# Patient Record
Sex: Male | Born: 1940 | Race: Black or African American | Hispanic: No | Marital: Married | Smoking: Never smoker
Health system: Southern US, Community
[De-identification: ages and names within clinical notes are randomized; demographics above are authoritative.]

---

## 2020-10-30 ENCOUNTER — Emergency Department (HOSPITAL_BASED_OUTPATIENT_CLINIC_OR_DEPARTMENT_OTHER): Payer: Medicare Other

## 2020-10-30 ENCOUNTER — Other Ambulatory Visit: Payer: Self-pay

## 2020-10-30 ENCOUNTER — Inpatient Hospital Stay (HOSPITAL_BASED_OUTPATIENT_CLINIC_OR_DEPARTMENT_OTHER)
Admission: EM | Admit: 2020-10-30 | Discharge: 2020-11-01 | DRG: 313 | Disposition: A | Discharge status: Home / Self Care | Payer: Medicare Other | Attending: Internal Medicine | Admitting: Internal Medicine

## 2020-10-30 ENCOUNTER — Encounter (HOSPITAL_BASED_OUTPATIENT_CLINIC_OR_DEPARTMENT_OTHER): Payer: Self-pay

## 2020-10-30 DIAGNOSIS — R079 Chest pain, unspecified: Secondary | ICD-10-CM | POA: Diagnosis present

## 2020-10-30 DIAGNOSIS — R7303 Prediabetes: Secondary | ICD-10-CM | POA: Diagnosis present

## 2020-10-30 DIAGNOSIS — Z20822 Contact with and (suspected) exposure to covid-19: Secondary | ICD-10-CM | POA: Diagnosis present

## 2020-10-30 DIAGNOSIS — E785 Hyperlipidemia, unspecified: Secondary | ICD-10-CM | POA: Diagnosis not present

## 2020-10-30 DIAGNOSIS — D509 Iron deficiency anemia, unspecified: Secondary | ICD-10-CM | POA: Diagnosis not present

## 2020-10-30 DIAGNOSIS — D649 Anemia, unspecified: Secondary | ICD-10-CM | POA: Diagnosis present

## 2020-10-30 DIAGNOSIS — Z886 Allergy status to analgesic agent status: Secondary | ICD-10-CM | POA: Diagnosis not present

## 2020-10-30 DIAGNOSIS — R001 Bradycardia, unspecified: Secondary | ICD-10-CM | POA: Diagnosis present

## 2020-10-30 DIAGNOSIS — R011 Cardiac murmur, unspecified: Secondary | ICD-10-CM | POA: Diagnosis present

## 2020-10-30 DIAGNOSIS — Z880 Allergy status to penicillin: Secondary | ICD-10-CM | POA: Diagnosis not present

## 2020-10-30 LAB — BASIC METABOLIC PANEL
Anion gap: 7 (ref 5–15)
BUN: 19 mg/dL (ref 8–23)
CO2: 25 mmol/L (ref 22–32)
Calcium: 9.4 mg/dL (ref 8.9–10.3)
Chloride: 106 mmol/L (ref 98–111)
Creatinine, Ser: 1.25 mg/dL — ABNORMAL HIGH (ref 0.61–1.24)
GFR, Estimated: 59 mL/min — ABNORMAL LOW (ref >60)
Glucose, Bld: 120 mg/dL — ABNORMAL HIGH (ref 70–99)
Potassium: 3.5 mmol/L (ref 3.5–5.1)
Sodium: 138 mmol/L (ref 135–145)

## 2020-10-30 LAB — CBC
HCT: 37.5 % — ABNORMAL LOW (ref 39.0–52.0)
Hemoglobin: 12.5 g/dL — ABNORMAL LOW (ref 13.0–17.0)
MCH: 30.3 pg (ref 26.0–34.0)
MCHC: 33.3 g/dL (ref 30.0–36.0)
MCV: 90.8 fL (ref 80.0–100.0)
Platelets: 235 10*3/uL (ref 150–400)
RBC: 4.13 MIL/uL — ABNORMAL LOW (ref 4.22–5.81)
RDW: 13 % (ref 11.5–15.5)
WBC: 3.9 10*3/uL — ABNORMAL LOW (ref 4.0–10.5)
nRBC: 0 % (ref 0.0–0.2)

## 2020-10-30 LAB — LIPID PANEL
Cholesterol: 164 mg/dL (ref 0–200)
HDL: 38 mg/dL — ABNORMAL LOW (ref >40)
LDL Cholesterol: 118 mg/dL — ABNORMAL HIGH (ref 0–99)
Total CHOL/HDL Ratio: 4.3 RATIO
Triglycerides: 40 mg/dL (ref <150)
VLDL: 8 mg/dL (ref 0–40)

## 2020-10-30 LAB — RESP PANEL BY RT-PCR (FLU A&B, COVID) ARPGX2
Influenza A by PCR: NEGATIVE
Influenza B by PCR: NEGATIVE
SARS Coronavirus 2 by RT PCR: NEGATIVE

## 2020-10-30 LAB — TROPONIN I (HIGH SENSITIVITY)
Troponin I (High Sensitivity): 4 ng/L (ref <18)
Troponin I (High Sensitivity): 3 ng/L (ref <18)

## 2020-10-30 IMAGING — CT CT HEAD W/O CM
3 series · 15 of 47 positions shown, 18 images · non-contrast
Comparison: None.

CLINICAL DATA: Numbness and tingling in left shoulder

Shortness of breath
Chest pain
EXAM:
CT HEAD WITHOUT CONTRAST
TECHNIQUE: Contiguous axial images were obtained from the base of the skull
through the vertex without intravenous contrast.

[Series 2: head wo · axial · 0.44mm/px · z∈[-153,-23]mm · 9 of 32 slices shown, 12 images]
[im 3/32  brain]
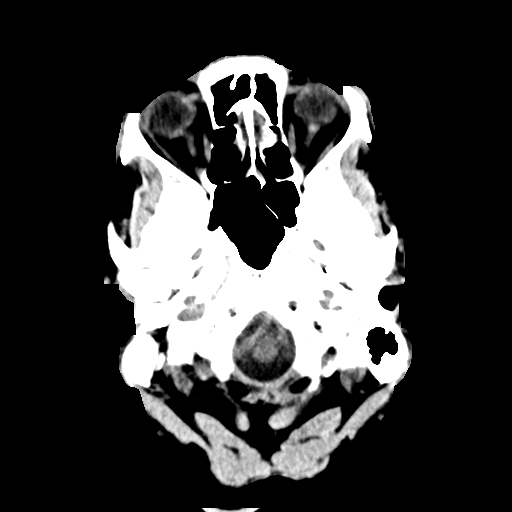
[im 3/32  bone]
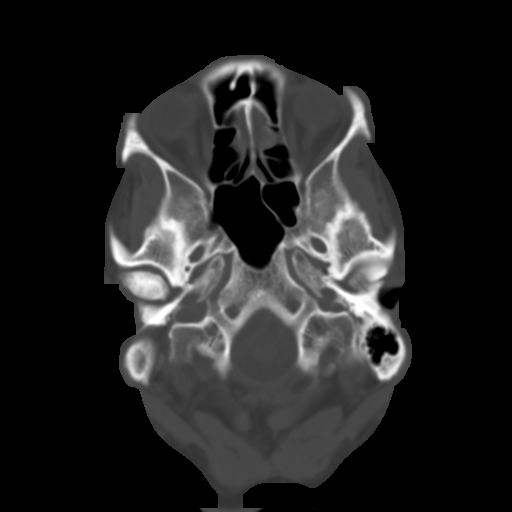
[im 6/32  brain]
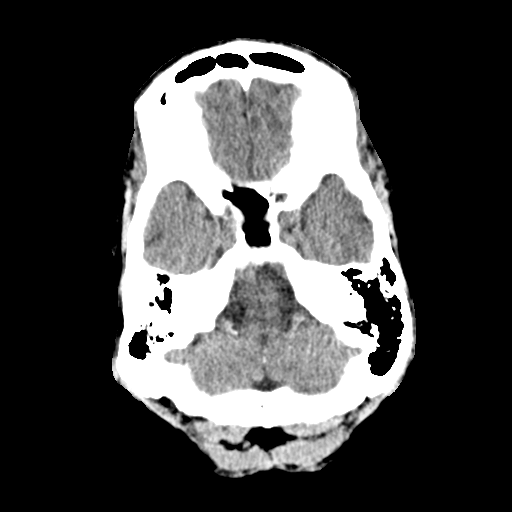
[im 9/32  brain]
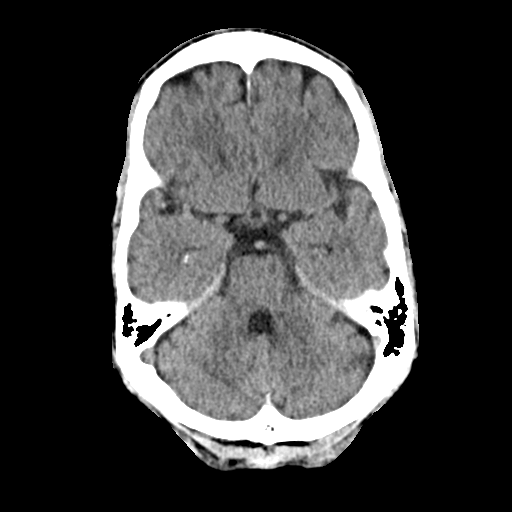
[im 12/32  brain]
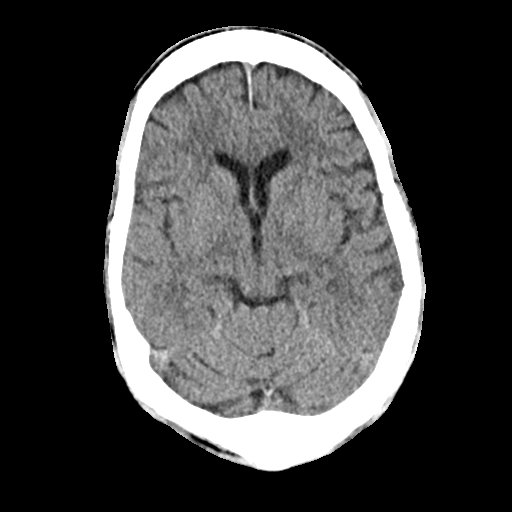
[im 17/32  brain]
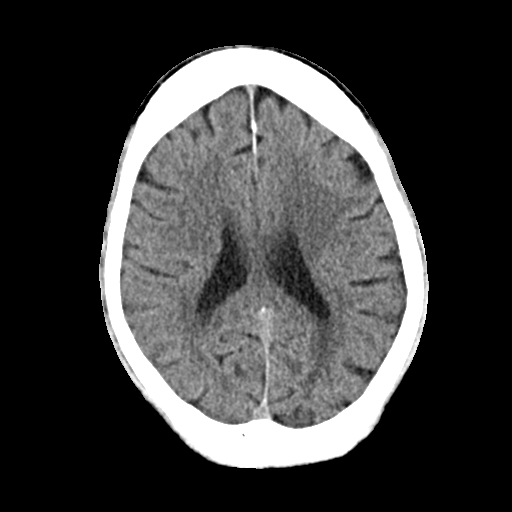
[im 17/32  bone]
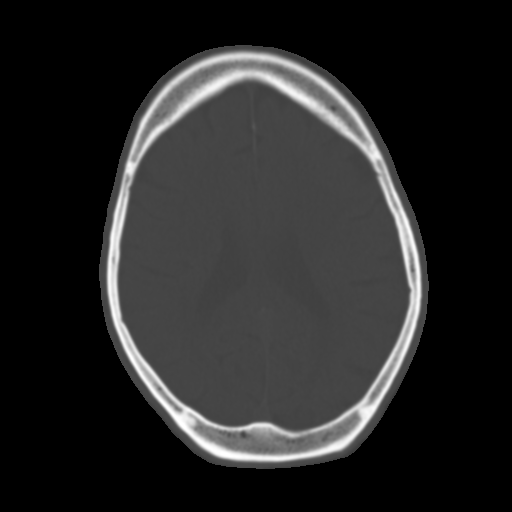
[im 20/32  brain]
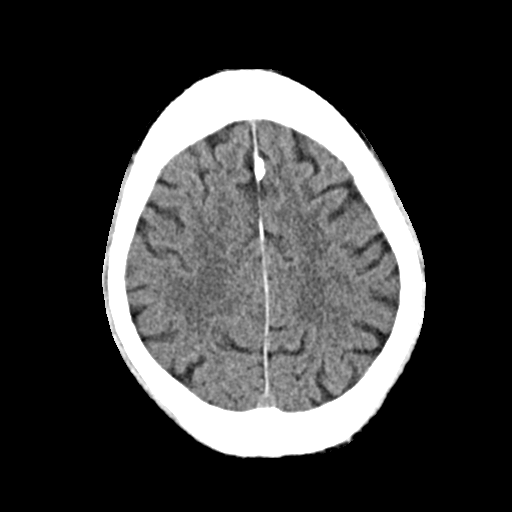
[im 23/32  brain]
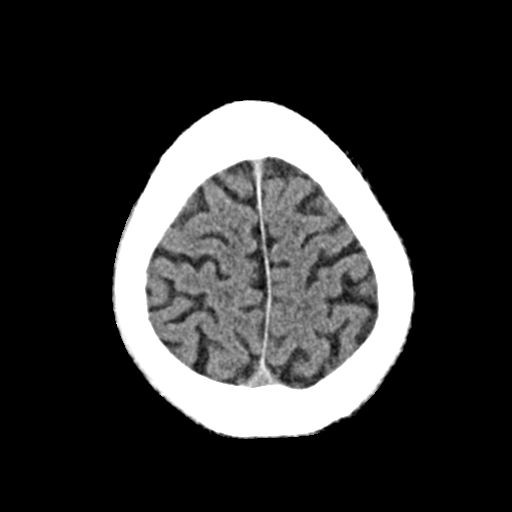
[im 26/32  brain]
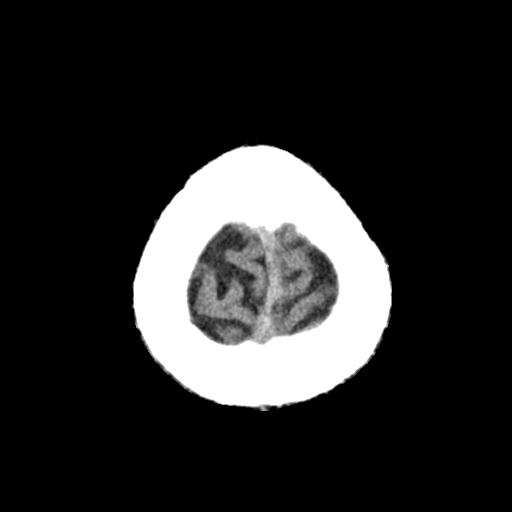
[im 29/32  brain]
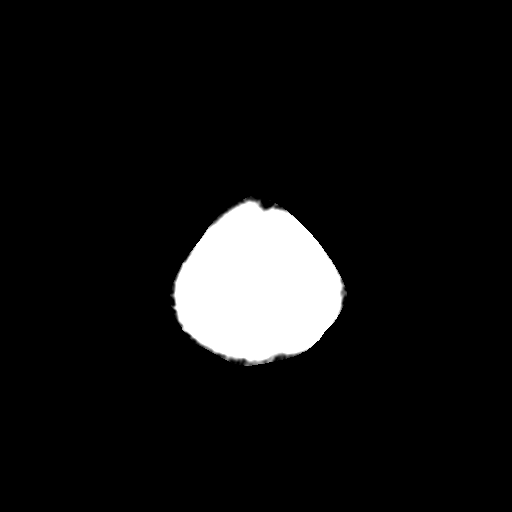
[im 29/32  bone]
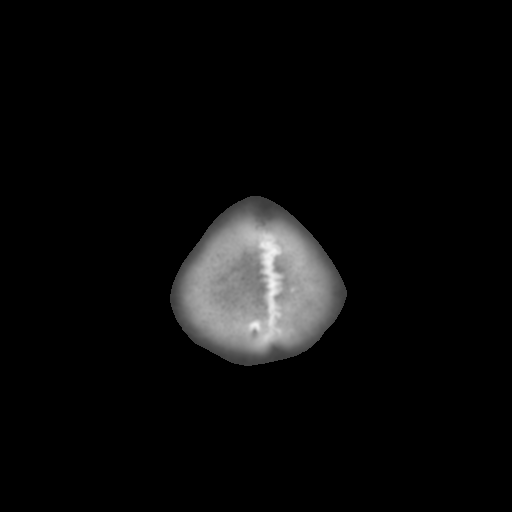

[Series 4: coronal soft · coronal · 0.32mm/px · 3 of 72 slices shown]
[im 24/72  brain]
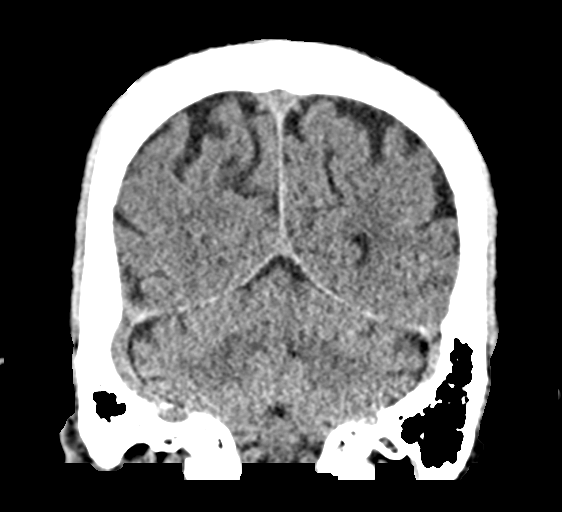
[im 32/72  brain]
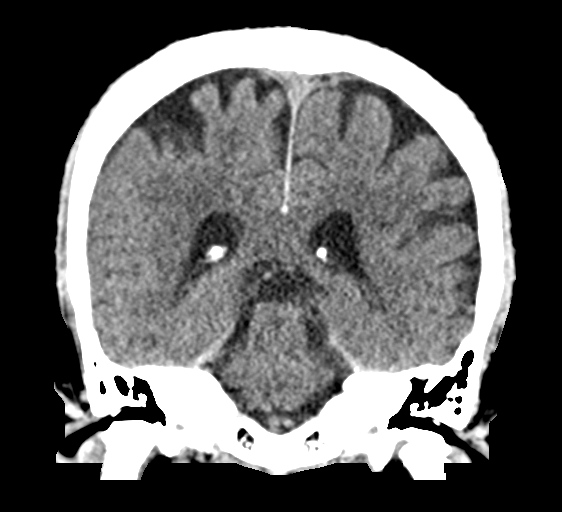
[im 40/72  brain]
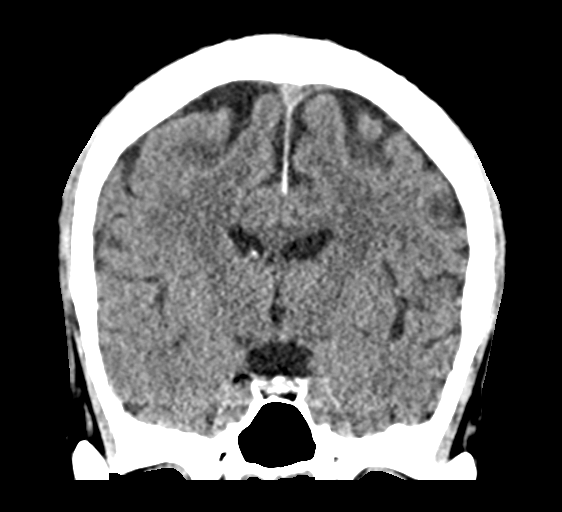

[Series 5: sag soft · sagittal · 0.32mm/px · 3 of 55 slices shown]
[im 19/55  brain]
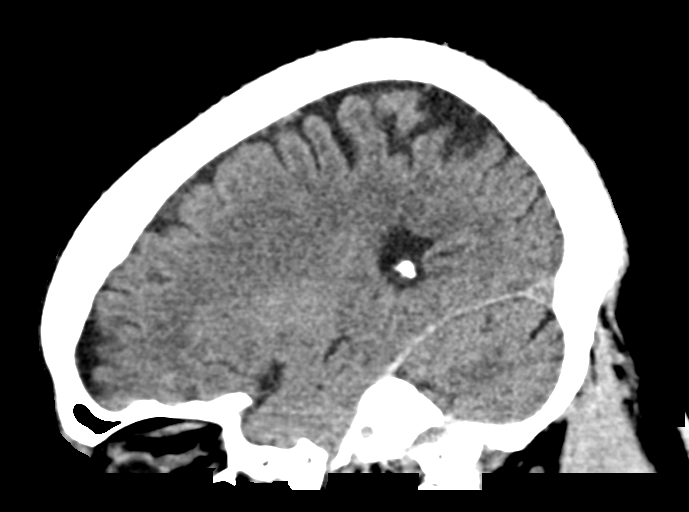
[im 28/55  brain]
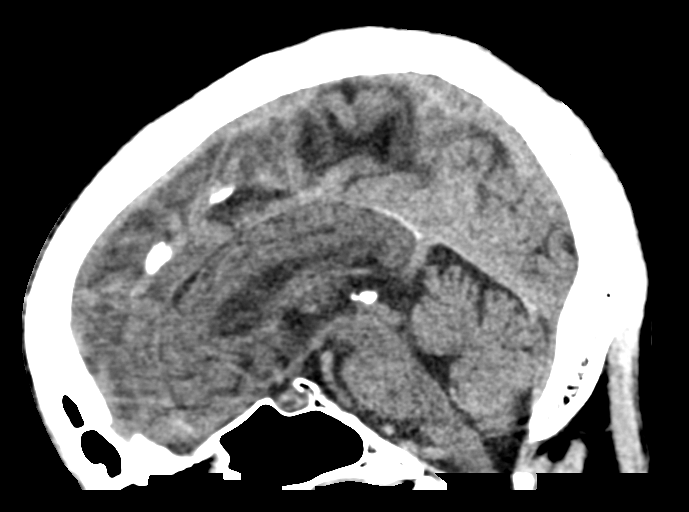
[im 37/55  brain]
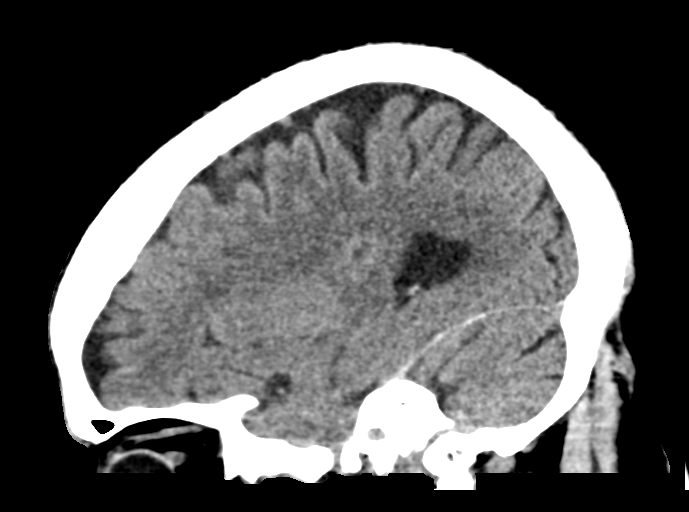

[15 of 47 positions shown; findings below may reference images not displayed]

FINDINGS: Brain: No evidence of acute infarction, hemorrhage, hydrocephalus,
extra-axial collection or mass lesion/mass effect.

Vascular: No hyperdense vessel or unexpected calcification.

Skull: Normal. Negative for fracture or focal lesion.

Sinuses/Orbits: No acute finding.

Other: None.
IMPRESSION: No acute intracranial abnormality.

## 2020-10-30 MED ORDER — ASPIRIN 81 MG PO CHEW
324.0000 mg | CHEWABLE_TABLET | Freq: Once | ORAL | Status: AC
  Administered 2020-10-30: 324 mg via ORAL
  Filled 2020-10-30: qty 4

## 2020-10-30 MED ORDER — NITROGLYCERIN 0.4 MG SL SUBL
0.4000 mg | SUBLINGUAL_TABLET | SUBLINGUAL | Status: DC | PRN
  Administered 2020-10-30 (×3): 0.4 mg via SUBLINGUAL
  Filled 2020-10-30: qty 1

## 2020-10-30 MED ORDER — ENOXAPARIN SODIUM 40 MG/0.4ML IJ SOSY
40.0000 mg | PREFILLED_SYRINGE | INTRAMUSCULAR | Status: DC
  Administered 2020-10-30 – 2020-10-31 (×2): 40 mg via SUBCUTANEOUS
  Filled 2020-10-30 (×2): qty 0.4

## 2020-10-30 MED ORDER — MORPHINE SULFATE (PF) 4 MG/ML IV SOLN
4.0000 mg | Freq: Once | INTRAVENOUS | Status: AC
  Administered 2020-10-30: 4 mg via INTRAVENOUS
  Filled 2020-10-30: qty 1

## 2020-10-30 MED ORDER — ONDANSETRON HCL 4 MG/2ML IJ SOLN: 4.0000 mg | Freq: Four times a day (QID) | INTRAMUSCULAR | Status: DC | PRN

## 2020-10-30 MED ORDER — ACETAMINOPHEN 325 MG PO TABS: 650.0000 mg | ORAL_TABLET | ORAL | Status: DC | PRN

## 2020-10-30 MED ORDER — ENOXAPARIN SODIUM 30 MG/0.3ML IJ SOSY: 30.0000 mg | PREFILLED_SYRINGE | INTRAMUSCULAR | Status: DC

## 2020-10-30 NOTE — ED Provider Notes (Signed)
[facility] EMERGENCY DEPARTMENT Provider Note   CSN: [id number] Arrival date & time: 10/30/20  1108     History Chief Complaint  Patient presents with   Chest Pain    Juan Crosby is a 80 y.o. male.  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 Soc etoh   History reviewed. No pertinent past medical history.  Patient Active Problem List   Diagnosis Date Noted   Chest pain 10/30/2020    History reviewed. No pertinent surgical history.     History reviewed. No pertinent family history.  Social History   Tobacco Use   Smoking status: Never   Smokeless tobacco: Never  Substance Use Topics   Alcohol use: Yes    Comment: occasional   Drug use: Never    Home Medications Prior to Admission medications   Medication Sig Start Date End Date Taking? Authorizing Provider  Ferrous Sulfate (IRON PO) Take 1 tablet by mouth daily.   Yes [provider]  vitamin B-12 (CYANOCOBALAMIN) 100 MCG tablet Take 100 mcg by mouth daily.   Yes [provider]    Allergies     Aspirin, Shellfish allergy, and Penicillins  Review of Systems   Review of Systems  Constitutional:  Positive for fatigue. Negative for fever.  Eyes:  Negative for visual disturbance.  Respiratory:  Positive for shortness of breath. Negative for cough.   Cardiovascular:  Positive for chest pain.  Gastrointestinal:  Negative for abdominal pain, nausea and vomiting.  Skin:  Negative for rash.  Neurological:  Positive for headaches (one week). Negative for tremors, syncope, facial asymmetry, weakness and numbness.   Physical Exam Updated Vital Signs BP 129/77 (BP Location: Right Arm)   Pulse (!) 52   Temp 97.8 F (36.6 C) (Oral)   Resp 18   Ht 5\' 11"  (1.803 m)   Wt 78.4 kg   SpO2 100%   BMI 24.11 kg/m   Physical Exam Vitals and nursing note reviewed.  Constitutional:      General: He is not in acute distress.    Appearance: Normal appearance. He is well-developed. He is not ill-appearing or diaphoretic.  HENT:     Head: Normocephalic and atraumatic.  Eyes:     General: No visual field deficit.    Extraocular Movements: Extraocular movements intact.     Conjunctiva/sclera: Conjunctivae normal.     Pupils: Pupils are equal, round, and reactive to light.  Cardiovascular:     Rate and Rhythm: Normal rate and regular rhythm.     Pulses: Normal  pulses.     Heart sounds: Normal heart sounds. No murmur heard.   No friction rub. No gallop.  Pulmonary:     Effort: Pulmonary effort is normal. No respiratory distress.     Breath sounds: Normal breath sounds. No wheezing or rales.  Abdominal:     General: There is no distension.     Palpations: Abdomen is soft.     Tenderness: There is no abdominal tenderness. There is no guarding.  Musculoskeletal:        General: No swelling or tenderness.     Cervical back: Normal range of motion.  Skin:    General: Skin is warm and dry.     Findings: No erythema or rash.  Neurological:     General: No focal deficit present.     Mental  Status: He is alert and oriented to person, place, and time.     GCS: GCS eye subscore is 4. GCS verbal subscore is 5. GCS motor subscore is 6.     Cranial Nerves: No cranial nerve deficit, dysarthria or facial asymmetry.     Sensory: No sensory deficit.     Motor: No weakness (chronic left sided shoulder problems from previous shoulder surgery) or tremor.     Coordination: Coordination normal. Finger-Nose-Finger Test normal.    ED Results / Procedures / Treatments   Labs (all labs ordered are listed, but only abnormal results are displayed) Labs Reviewed  BASIC METABOLIC PANEL - Abnormal; Notable for the following components:      Result Value   Glucose, Bld 120 (*)    Creatinine, Ser 1.25 (*)    GFR, Estimated 59 (*)    All other components within normal limits  CBC - Abnormal; Notable for the following components:   WBC 3.9 (*)    RBC 4.13 (*)    Hemoglobin 12.5 (*)    HCT 37.5 (*)    All other components within normal limits  LIPID PANEL - Abnormal; Notable for the following components:   HDL 38 (*)    LDL Cholesterol 118 (*)    All other components within normal limits  RESP PANEL BY RT-PCR (FLU A&B, COVID) ARPGX2  TROPONIN I (HIGH SENSITIVITY)  TROPONIN I (HIGH SENSITIVITY)    EKG EKG Interpretation  Date/Time:  Tuesday October 30 2020 11:17:43 EDT Ventricular Rate:  70 PR Interval:  152 QRS Duration: 86 QT Interval:  376 QTC Calculation: 406 R Axis:   57 Text Interpretation: Normal sinus rhythm Normal ECG No previous ECGs available Confirmed by Alvira Monday ([id number]) on 10/30/2020 12:14:14 PM  Radiology DG Chest 2 View  Result Date: 10/30/2020 CLINICAL DATA:  Chest pain and shortness of breath. EXAM: CHEST - 2 VIEW COMPARISON:  None. FINDINGS: The heart size and mediastinal contours are within normal limits. Both lungs are clear. Pulmonary hyperinflation is noted, suspicious for COPD. Thoracic spine degenerative changes are seen, as well as cervical spine fusion  hardware. 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 No active cardiopulmonary disease. Electronically Signed   By: Danae Orleans M.D.   On: 10/30/2020 11:56   CT Head Wo Contrast  Result Date: 10/30/2020 CLINICAL DATA:  Numbness and tingling in left shoulder Shortness of breath Chest pain EXAM: CT HEAD WITHOUT CONTRAST TECHNIQUE: Contiguous axial images were obtained from the base of the skull through the vertex without intravenous contrast. COMPARISON:  None. FINDINGS: Brain: No evidence of acute infarction, hemorrhage, hydrocephalus, extra-axial collection or mass lesion/mass effect. Vascular: No hyperdense vessel or unexpected calcification. Skull:  Normal. Negative for fracture or focal lesion. Sinuses/Orbits: No acute finding. Other: None. IMPRESSION: No acute intracranial abnormality. Electronically Signed   By: Acquanetta Belling M.D.   On: 10/30/2020 13:31    Procedures Procedures   Medications Ordered in ED Medications  nitroGLYCERIN (NITROSTAT) SL tablet 0.4 mg (0.4 mg Sublingual Given 10/30/20 1431)  acetaminophen (TYLENOL) tablet 650 mg (has no administration in time range)  ondansetron (ZOFRAN) injection 4 mg (has no administration in time range)  enoxaparin (LOVENOX) injection 40 mg (40 mg Subcutaneous Given 10/30/20 1859)  aspirin chewable tablet 324 mg (324 mg Oral Given 10/30/20 1433)  morphine 4 MG/ML injection 4 mg (4 mg Intravenous Given 10/30/20 1435)    ED Course  I have reviewed the triage vital signs and the nursing notes.  Pertinent labs & imaging results that were available during my care of the patient were reviewed by me and considered in my medical decision making (see chart for details).    MDM Rules/Calculators/A&P                          80yo male with history of borderline DM, hyperlipidemia, presents with concern for left sided chest pressure with radiation to left arm.  Differential diagnosis for chest pain includes pulmonary embolus, dissection, pneumothorax, pneumonia, ACS,  myocarditis, pericarditis.  EKG was done and evaluate by me and showed no acute ST changes and no signs of pericarditis. Chest x-ray was done and evaluated by me and radiology and showed no sign of pneumonia or pneumothorax. Low suspicion for PE given history, no hypoxia, no tachycardia.  Do not feel history or exam are consistent with aortic dissection.  Describes numbness of left arm without other neurologic abnormalities. Given this numbness/tingling and headache, CT head was completed which shows no acute intracranial abnormalities.  With nitroglycerin, both his chest pain and left arm numbness/tingling resolved, and given this suspect arm symptoms secondary to cardiac equivalent and doubt CVA or TIA.  Pain did return in chest after initial resolution with introglycerin without return of arm symptoms. Given additional medications, plan on admitting to the hospital for chest pain evaluation given HEAR score 4.  Given aspirin.    Final Clinical Impression(s) / ED Diagnoses Final diagnoses:  Chest pain with high risk for cardiac etiology    Rx / DC Orders ED Discharge Orders     None        Alvira Monday, MD 10/30/20 2157

## 2020-10-30 NOTE — H&P (Signed)
History and Physical    Juan Crosby ION:[id number] DOB: 10-02-1940 DOA: 10/30/2020  [facility]P: [facility] (Confirm with patient/family/NH records and if not entered, this has to be entered at [facility] point of entry) Patient coming from: Home  I have personally briefly reviewed patient's old medical records in [facility]  Chief Complaint: Chest pain  HPI: Juan Crosby is a 80 y.o. male with medical history significant of prediabetes, iron deficiency anemia, came with new onset of chest pain.  First episode started one week ago, patient was watching TV, suddenly started 7/10 pressure-like chest pain, retrosternal, nonradiating, no other associated symptoms, the whole episode lasted for few minutes and subsided by its own.  Then in the next few days, he has had several similar episode, or resolved by itself.  This morning, chest pain woke him up, 10/10, radiated to the left shoulder than upper arm, associated with severe sweating and shortness of breath. Called 911.  ED Course: EKG showed minimal ST elevation on lateral leads, physical exam found patient was bradycardia, blood pressure within normal limits no hypoxia.  Troponin negative x2.  Patient was given 1 dose of nitro and chest pain eased but later came back about 1 and half hour later, and had to take another nitro. No more chest pain since.  Review of Systems: As per HPI otherwise 14 point review of systems negative.    History reviewed. No pertinent past medical history.  History reviewed. No pertinent surgical history.   reports that he has never smoked. He has never used smokeless tobacco. He reports current alcohol use. He reports that he does not use drugs.  Allergies  Allergen Reactions   Aspirin Other (See Comments)    Stomach acidity   Penicillins Rash    History reviewed. No pertinent family history.   Prior to Admission medications   Not on File    Physical Exam: Vitals:   10/30/20 1404 10/30/20  1500 10/30/20 1550 10/30/20 1637  BP: 116/68 112/75 140/78 (!) 145/84  Pulse: (!) 49 (!) 50 (!) 51 (!) 51  Resp: 18 16 13 16   Temp:    97.7 F (36.5 C)  TempSrc:    Oral  SpO2: 95% 96% 98% 100%  Weight:    78.4 kg  Height:    5\' 11"  (1.803 m)    Constitutional: NAD, calm, comfortable Vitals:   10/30/20 1404 10/30/20 1500 10/30/20 1550 10/30/20 1637  BP: 116/68 112/75 140/78 (!) 145/84  Pulse: (!) 49 (!) 50 (!) 51 (!) 51  Resp: 18 16 13 16   Temp:    97.7 F (36.5 C)  TempSrc:    Oral  SpO2: 95% 96% 98% 100%  Weight:    78.4 kg  Height:    5\' 11"  (1.803 m)   Eyes: PERRL, lids and conjunctivae normal ENMT: Mucous membranes are moist. Posterior pharynx clear of any exudate or lesions.Normal dentition.  Neck: normal, supple, no masses, no thyromegaly Respiratory: clear to auscultation bilaterally, no wheezing, no crackles. Normal respiratory effort. No accessory muscle use.  Cardiovascular: Regular rate and rhythm, systolic murmur on heart base. No extremity edema. 2+ pedal pulses. No carotid bruits.  Abdomen: no tenderness, no masses palpated. No hepatosplenomegaly. Bowel sounds positive.  Musculoskeletal: no clubbing / cyanosis. No joint deformity upper and lower extremities. Good ROM, no contractures. Normal muscle tone.  Skin: no rashes, lesions, ulcers. No induration Neurologic: CN 2-12 grossly intact. Sensation intact, DTR normal. Strength 5/5 in all 4.  Psychiatric: Normal  judgment and insight. Alert and oriented x 3. Normal mood.     Labs on Admission: I have personally reviewed following labs and imaging studies  CBC: Recent Labs  Lab 10/30/20 1127  WBC 3.9*  HGB 12.5*  HCT 37.5*  MCV 90.8  PLT 235   Basic Metabolic Panel: Recent Labs  Lab 10/30/20 1127  NA 138  K 3.5  CL 106  CO2 25  GLUCOSE 120*  BUN 19  CREATININE 1.25*  CALCIUM 9.4   GFR: Estimated Creatinine Clearance: 51 mL/min (A) (by C-G formula based on SCr of 1.25 mg/dL (H)). Liver  Function Tests: No results for input(s): AST, ALT, ALKPHOS, BILITOT, PROT, ALBUMIN in the last 168 hours. No results for input(s): LIPASE, AMYLASE in the last 168 hours. No results for input(s): AMMONIA in the last 168 hours. Coagulation Profile: No results for input(s): INR, PROTIME in the last 168 hours. Cardiac Enzymes: No results for input(s): CKTOTAL, CKMB, CKMBINDEX, TROPONINI in the last 168 hours. BNP (last 3 results) No results for input(s): PROBNP in the last 8760 hours. HbA1C: No results for input(s): HGBA1C in the last 72 hours. CBG: No results for input(s): GLUCAP in the last 168 hours. Lipid Profile: No results for input(s): CHOL, HDL, LDLCALC, TRIG, CHOLHDL, LDLDIRECT in the last 72 hours. Thyroid Function Tests: No results for input(s): TSH, T4TOTAL, FREET4, T3FREE, THYROIDAB in the last 72 hours. Anemia Panel: No results for input(s): VITAMINB12, FOLATE, FERRITIN, TIBC, IRON, RETICCTPCT in the last 72 hours. Urine analysis: No results found for: COLORURINE, APPEARANCEUR, LABSPEC, PHURINE, GLUCOSEU, HGBUR, BILIRUBINUR, KETONESUR, PROTEINUR, UROBILINOGEN, NITRITE, LEUKOCYTESUR  Radiological Exams on Admission: DG Chest 2 View  Result Date: 10/30/2020 CLINICAL DATA:  Chest pain and shortness of breath. EXAM: CHEST - 2 VIEW COMPARISON:  None. FINDINGS: The heart size and mediastinal contours are within normal limits. Both lungs are clear. Pulmonary hyperinflation is noted, suspicious for COPD. Thoracic spine degenerative changes are seen, as well as cervical spine fusion hardware. 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 No active cardiopulmonary disease. Electronically Signed   By: Danae Orleans M.D.   On: 10/30/2020 11:56   CT Head Wo Contrast  Result Date: 10/30/2020 CLINICAL DATA:  Numbness and tingling in left shoulder Shortness of breath Chest pain EXAM: CT HEAD WITHOUT CONTRAST TECHNIQUE: Contiguous axial images were obtained from the base of the skull through the vertex without  intravenous contrast. COMPARISON:  None. FINDINGS: Brain: No evidence of acute infarction, hemorrhage, hydrocephalus, extra-axial collection or mass lesion/mass effect. Vascular: No hyperdense vessel or unexpected calcification. Skull: Normal. Negative for fracture or focal lesion. Sinuses/Orbits: No acute finding. Other: None. IMPRESSION: No acute intracranial abnormality. Electronically Signed   By: Acquanetta Belling M.D.   On: 10/30/2020 13:31    EKG: Independently reviewed. Sinus, minimal ST elevation on lateral leads.  Assessment/Plan Active Problems:   Chest pain  (please populate well all problems here in Problem List. (For example, if patient is on BP meds at home and you resume or decide to hold them, it is a problem that needs to be her. Same for CAD, COPD, HLD and so on)  Chest pain -Suspect angina. ACS ruled out. -Estimated 10 year risk of CAD 10-20%, ordered pharmacological stress test in AM, consider cardiology consult after stress test. -Echo. Lipid panel.  Asymptomatic bradycardia -Monitor  Heart murmur -Check Echo.  Pre-diabetes -Continue low carb diet.  Iron deficiency anemia -Continue iron supplement. Had a [facility] and [facility] with [facility].  DVT prophylaxis: Lovenox  Code Status: Full Code Family Communication: None at bedside Disposition Plan: Expect less than 2 midnight hospital stay Consults called: None Admission status:    Emeline General MD [facility] Pager [phone]  10/30/2020, 5:42 PM

## 2020-10-30 NOTE — Progress Notes (Signed)
Received patient from [facility], via [facility], report given by [facility] nurse. Pt alert and oriented. Denies chest pain; Oriented to surroundings; VSS.

## 2020-10-30 NOTE — ED Triage Notes (Signed)
Pt c/o chest pain the past few days. Also states SOB and tingling in left shoulder.

## 2020-10-31 ENCOUNTER — Observation Stay (HOSPITAL_COMMUNITY): Payer: Medicare Other

## 2020-10-31 DIAGNOSIS — Z880 Allergy status to penicillin: Secondary | ICD-10-CM | POA: Diagnosis not present

## 2020-10-31 DIAGNOSIS — R7303 Prediabetes: Secondary | ICD-10-CM

## 2020-10-31 DIAGNOSIS — D509 Iron deficiency anemia, unspecified: Secondary | ICD-10-CM | POA: Diagnosis present

## 2020-10-31 DIAGNOSIS — R079 Chest pain, unspecified: Secondary | ICD-10-CM | POA: Diagnosis present

## 2020-10-31 DIAGNOSIS — E785 Hyperlipidemia, unspecified: Secondary | ICD-10-CM | POA: Diagnosis present

## 2020-10-31 DIAGNOSIS — I208 Other forms of angina pectoris: Secondary | ICD-10-CM

## 2020-10-31 DIAGNOSIS — Z886 Allergy status to analgesic agent status: Secondary | ICD-10-CM | POA: Diagnosis not present

## 2020-10-31 DIAGNOSIS — D649 Anemia, unspecified: Secondary | ICD-10-CM | POA: Diagnosis not present

## 2020-10-31 DIAGNOSIS — R011 Cardiac murmur, unspecified: Secondary | ICD-10-CM | POA: Diagnosis not present

## 2020-10-31 DIAGNOSIS — E7800 Pure hypercholesterolemia, unspecified: Secondary | ICD-10-CM | POA: Diagnosis not present

## 2020-10-31 DIAGNOSIS — Z20822 Contact with and (suspected) exposure to covid-19: Secondary | ICD-10-CM | POA: Diagnosis present

## 2020-10-31 DIAGNOSIS — R001 Bradycardia, unspecified: Secondary | ICD-10-CM | POA: Diagnosis present

## 2020-10-31 LAB — CBC WITH DIFFERENTIAL/PLATELET
Abs Immature Granulocytes: 0 10*3/uL (ref 0.00–0.07)
Basophils Absolute: 0 10*3/uL (ref 0.0–0.1)
Basophils Relative: 1 %
Eosinophils Absolute: 0.3 10*3/uL (ref 0.0–0.5)
Eosinophils Relative: 8 %
HCT: 38.9 % — ABNORMAL LOW (ref 39.0–52.0)
Hemoglobin: 12.5 g/dL — ABNORMAL LOW (ref 13.0–17.0)
Immature Granulocytes: 0 %
Lymphocytes Relative: 47 %
Lymphs Abs: 1.6 10*3/uL (ref 0.7–4.0)
MCH: 29.4 pg (ref 26.0–34.0)
MCHC: 32.1 g/dL (ref 30.0–36.0)
MCV: 91.5 fL (ref 80.0–100.0)
Monocytes Absolute: 0.4 10*3/uL (ref 0.1–1.0)
Monocytes Relative: 11 %
Neutro Abs: 1.1 10*3/uL — ABNORMAL LOW (ref 1.7–7.7)
Neutrophils Relative %: 33 %
Platelets: 229 10*3/uL (ref 150–400)
RBC: 4.25 MIL/uL (ref 4.22–5.81)
RDW: 12.8 % (ref 11.5–15.5)
WBC: 3.3 10*3/uL — ABNORMAL LOW (ref 4.0–10.5)
nRBC: 0 % (ref 0.0–0.2)

## 2020-10-31 LAB — TROPONIN I (HIGH SENSITIVITY)
Troponin I (High Sensitivity): 5 ng/L (ref <18)
Troponin I (High Sensitivity): 5 ng/L (ref <18)
Troponin I (High Sensitivity): 5 ng/L (ref <18)

## 2020-10-31 LAB — COMPREHENSIVE METABOLIC PANEL
ALT: 14 U/L (ref 0–44)
AST: 22 U/L (ref 15–41)
Albumin: 3.7 g/dL (ref 3.5–5.0)
Alkaline Phosphatase: 43 U/L (ref 38–126)
Anion gap: 6 (ref 5–15)
BUN: 15 mg/dL (ref 8–23)
CO2: 25 mmol/L (ref 22–32)
Calcium: 9.2 mg/dL (ref 8.9–10.3)
Chloride: 107 mmol/L (ref 98–111)
Creatinine, Ser: 1.13 mg/dL (ref 0.61–1.24)
GFR, Estimated: >60 mL/min (ref >60)
Glucose, Bld: 85 mg/dL (ref 70–99)
Potassium: 4 mmol/L (ref 3.5–5.1)
Sodium: 138 mmol/L (ref 135–145)
Total Bilirubin: 1 mg/dL (ref 0.3–1.2)
Total Protein: 6.6 g/dL (ref 6.5–8.1)

## 2020-10-31 LAB — PHOSPHORUS: Phosphorus: 3.3 mg/dL (ref 2.5–4.6)

## 2020-10-31 LAB — HEMOGLOBIN A1C
Hgb A1c MFr Bld: 5.8 % — ABNORMAL HIGH (ref 4.8–5.6)
Mean Plasma Glucose: 119.76 mg/dL

## 2020-10-31 LAB — ECHOCARDIOGRAM COMPLETE
Area-P 1/2: 3.27 cm2
Calc EF: 58.3 %
Height: 71 in
S' Lateral: 3.1 cm
Single Plane A2C EF: 62.8 %
Single Plane A4C EF: 50.6 %
Weight: 2769.6 oz

## 2020-10-31 LAB — NM MYOCAR MULTI W/SPECT W/WALL MOTION / EF
Estimated workload: 1 METS
Exercise duration (min): 7 min
Exercise duration (sec): 45 s
MPHR: 141 {beats}/min
Peak HR: 74 {beats}/min
Percent HR: 52 %
Rest HR: 44 {beats}/min

## 2020-10-31 LAB — MAGNESIUM: Magnesium: 2 mg/dL (ref 1.7–2.4)

## 2020-10-31 MED ORDER — MORPHINE SULFATE (PF) 2 MG/ML IV SOLN
INTRAVENOUS | Status: AC
  Filled 2020-10-31: qty 1

## 2020-10-31 MED ORDER — MORPHINE SULFATE (PF) 2 MG/ML IV SOLN
2.0000 mg | INTRAVENOUS | Status: DC | PRN
  Administered 2020-10-31: 2 mg via INTRAVENOUS

## 2020-10-31 MED ORDER — ALUM & MAG HYDROXIDE-SIMETH 200-200-20 MG/5ML PO SUSP
15.0000 mL | Freq: Four times a day (QID) | ORAL | Status: DC | PRN
  Administered 2020-10-31: 15 mL via ORAL
  Filled 2020-10-31: qty 30

## 2020-10-31 MED ORDER — REGADENOSON 0.4 MG/5ML IV SOLN
INTRAVENOUS | Status: AC
  Administered 2020-10-31: 0.4 mg via INTRAVENOUS
  Filled 2020-10-31: qty 5

## 2020-10-31 MED ORDER — NITROGLYCERIN 0.4 MG SL SUBL
SUBLINGUAL_TABLET | SUBLINGUAL | Status: AC
  Filled 2020-10-31: qty 1

## 2020-10-31 MED ORDER — SODIUM CHLORIDE 0.9 % IV SOLN: INTRAVENOUS | Status: DC

## 2020-10-31 MED ORDER — REGADENOSON 0.4 MG/5ML IV SOLN: 0.4000 mg | Freq: Once | INTRAVENOUS | Status: AC

## 2020-10-31 MED ORDER — TECHNETIUM TC 99M TETROFOSMIN IV KIT
10.0000 | PACK | Freq: Once | INTRAVENOUS | Status: AC | PRN
  Administered 2020-10-31: 10 via INTRAVENOUS

## 2020-10-31 MED ORDER — NITROGLYCERIN 0.4 MG SL SUBL: 0.4000 mg | SUBLINGUAL_TABLET | Freq: Once | SUBLINGUAL | Status: AC

## 2020-10-31 NOTE — Progress Notes (Signed)
  Echocardiogram 2D Echocardiogram has been performed.  Stark Bray Swaim 10/31/2020, 10:21 AM

## 2020-10-31 NOTE — Progress Notes (Signed)
Pt asking if the pain could be gas,  offered ginger ale he drank the ginger ale and immediately started burping.  After which he states pain is almost gone

## 2020-10-31 NOTE — Plan of Care (Signed)
  Problem: Education: Goal: Understanding of cardiac disease, CV risk reduction, and recovery process will improve Outcome: Progressing   Problem: Activity: Goal: Ability to tolerate increased activity will improve Outcome: Progressing   Problem: Cardiac: Goal: Ability to achieve and maintain adequate cardiovascular perfusion will improve Outcome: Progressing   

## 2020-10-31 NOTE — Progress Notes (Signed)
Pt states chest pain is a 2 now

## 2020-10-31 NOTE — Progress Notes (Signed)
PROGRESS NOTE    Juan Crosby  FXJ:[id number] DOB: 1941-03-19 DOA: 10/30/2020 PCP: [facility]     Brief Narrative:  80 y.o. BM PMHx Prediabetes, iron deficiency anemia, came with new onset of chest pain.   First episode started one week ago, patient was watching TV, suddenly started 7/10 pressure-like chest pain, retrosternal, nonradiating, no other associated symptoms, the whole episode lasted for few minutes and subsided by its own.  Then in the next few days, he has had several similar episode, or resolved by itself.  This morning, chest pain woke him up, 10/10, radiated to the left shoulder than upper arm, associated with severe sweating and shortness of breath. Called 911.   ED Course: EKG showed minimal ST elevation on lateral leads, physical exam found patient was bradycardia, blood pressure within normal limits no hypoxia.  Troponin negative x2.  Patient was given 1 dose of nitro and chest pain eased but later came back about 1 and half hour later, and had to take another nitro. No more chest pain since.   Subjective: Afebrile overnight, A/O x4, negative CP, negative SOB.  Awaiting final results of nuclear stress test and echocardiogram.  States he is here from the [place] on vacation to see his daughter.   Assessment & Plan: Covid vaccination; vaccinated 4/4   Active Problems:   Chest pain  Chest pain - 7/6 paged by PA Theodore Demark, cardiology in the stress lab (stress test not started yet) actively having chest pain rated at 4/10.  My feeling is that patient should go for cardiac catheterization, and no stress test should be started on this patient who is now actively having chest pain again. - 7/6 PA Barrett will contact her cardiologist to determine if patient will go straight to cardiac catheterization. - Troponin x3 ordered stat.  EKG stat ordered. - NTG PRN, morphine PRN for pain not alleviated by NTG -7/6 echocardiogram nondiagnostic for chest pain  see results below -7/6 NM stress test nondiagnostic for chest pain see results below. -7/6 no further cardiac work-up required.  Asymptomatic bradycardia -Patient continues to be asymptomatic.  HLD - 7/6 LDL 118 - 7/6 start Lipitor 20 mg daily.  Patient to have PCP titrate as tolerated, goal LDL<70  test.   Heart murmur -Check Echo.  Echocardiogram WNL see results below   Pre-diabetes -7/6 Hemoglobin A1c = 5.8, which would put him in the prediabetes group. - Continue low carb diet. - Start Metformin 500 mg daily, have PCP monitor patient's hemoglobin A1c   Iron deficiency anemia? -Continue iron supplement. [facility]. -Anemia panel pending      DVT prophylaxis: Lovenox Code Status: Full Family Communication:  Status is: Inpatient  Remains inpatient appropriate because:Ongoing diagnostic testing needed not appropriate for outpatient work up  Dispo: The patient is from: Home              Anticipated d/c is to: Home              Anticipated d/c date is: 1 day              Patient currently is not medically stable to d/c.      Consultants:  Cardiology  Procedures/Significant Events:  7/6 Echocardiogram:Left Ventricle: LVEF  60 to 65%. No regional wall motion abnormalities. -Left ventricular diastolic parameters were normal.  7/6 nuclear medicine stress test;  No reversible ischemia or infarction. - Normal left ventricular wall motion. -  Left ventricular ejection fraction 55% -Non invasive risk stratification*: Low     I have personally reviewed and interpreted all radiology studies and my findings are as above.  VENTILATOR SETTINGS:    Cultures   Antimicrobials:    Devices    LINES / TUBES:      Continuous Infusions:   Objective: Vitals:   10/31/20 0031 10/31/20 0101 10/31/20 0509 10/31/20 0859  BP:  124/74 122/68 125/67  Pulse: (!) 52 (!) 46 (!) 55 (!) 49  Resp:  19 20   Temp:  (!) 97.4 F  (36.3 C) 98.5 F (36.9 C) (!) 97.4 F (36.3 C)  TempSrc:  Oral Oral Oral  SpO2:  100% 99% 99%  Weight:   78.5 kg   Height:        Intake/Output Summary (Last 24 hours) at 10/31/2020 0954 Last data filed at 10/31/2020 0522 Gross per 24 hour  Intake 240 ml  Output 600 ml  Net -360 ml   Filed Weights   10/30/20 1115 10/30/20 1637 10/31/20 0509  Weight: 78.9 kg 78.4 kg 78.5 kg    Examination:  General:, A/O x4 No acute respiratory distress Eyes: negative scleral hemorrhage, negative anisocoria, negative icterus ENT: Negative Runny nose, negative gingival bleeding, Neck:  Negative scars, masses, torticollis, lymphadenopathy, JVD Lungs: Clear to auscultation bilaterally without wheezes or crackles Cardiovascular: Regular rate and rhythm without murmur gallop or rub normal S1 and S2 Abdomen: negative abdominal pain, nondistended, positive soft, bowel sounds, no rebound, no ascites, no appreciable mass Extremities: No significant cyanosis, clubbing, or edema bilateral lower extremities Skin: Negative rashes, lesions, ulcers Psychiatric:  Negative depression, negative anxiety, negative fatigue, negative mania  Central nervous system:  Cranial nerves II through XII intact, tongue/uvula midline, all extremities muscle strength 5/5, sensation intact throughout,negative dysarthria, negative expressive aphasia, negative receptive aphasia.  .     Data Reviewed: Care during the described time interval was provided by me .  I have reviewed this patient's available data, including medical history, events of note, physical examination, and all test results as part of my evaluation.  CBC: Recent Labs  Lab 10/30/20 1127  WBC 3.9*  HGB 12.5*  HCT 37.5*  MCV 90.8  PLT 235   Basic Metabolic Panel: Recent Labs  Lab 10/30/20 1127  NA 138  K 3.5  CL 106  CO2 25  GLUCOSE 120*  BUN 19  CREATININE 1.25*  CALCIUM 9.4   GFR: Estimated Creatinine Clearance: 51 mL/min (A) (by C-G formula  based on SCr of 1.25 mg/dL (H)). Liver Function Tests: No results for input(s): AST, ALT, ALKPHOS, BILITOT, PROT, ALBUMIN in the last 168 hours. No results for input(s): LIPASE, AMYLASE in the last 168 hours. No results for input(s): AMMONIA in the last 168 hours. Coagulation Profile: No results for input(s): INR, PROTIME in the last 168 hours. Cardiac Enzymes: No results for input(s): CKTOTAL, CKMB, CKMBINDEX, TROPONINI in the last 168 hours. BNP (last 3 results) No results for input(s): PROBNP in the last 8760 hours. HbA1C: No results for input(s): HGBA1C in the last 72 hours. CBG: No results for input(s): GLUCAP in the last 168 hours. Lipid Profile: Recent Labs    10/30/20 1824  CHOL 164  HDL 38*  LDLCALC 118*  TRIG 40  CHOLHDL 4.3   Thyroid Function Tests: No results for input(s): TSH, T4TOTAL, FREET4, T3FREE, THYROIDAB in the last 72 hours. Anemia Panel: No results for input(s): VITAMINB12, FOLATE, FERRITIN, TIBC, IRON, RETICCTPCT in the last 72 hours.  Sepsis Labs: No results for input(s): PROCALCITON, LATICACIDVEN in the last 168 hours.  Recent Results (from the past 240 hour(s))  Resp Panel by RT-PCR (Flu A&B, Covid) Nasopharyngeal Swab     Status: None   Collection Time: 10/30/20  2:51 PM   Specimen: Nasopharyngeal Swab; Nasopharyngeal(NP) swabs in vial transport medium  Result Value Ref Range Status   SARS Coronavirus 2 by RT PCR NEGATIVE NEGATIVE Final    Comment: (NOTE) SARS-CoV-2 target nucleic acids are NOT DETECTED.  The SARS-CoV-2 RNA is generally detectable in upper respiratory specimens during the acute phase of infection. The lowest concentration of SARS-CoV-2 viral copies this assay can detect is 138 copies/mL. A negative result does not preclude SARS-Cov-2 infection and should not be used as the sole basis for treatment or other patient management decisions. A negative result may occur with  improper specimen collection/handling, submission of  specimen other than nasopharyngeal swab, presence of viral mutation(s) within the areas targeted by this assay, and inadequate number of viral copies(<138 copies/mL). A negative result must be combined with clinical observations, patient history, and epidemiological information. The expected result is Negative.  Fact Sheet for Patients:  BloggerCourse.com  Fact Sheet for Healthcare Providers:  SeriousBroker.it  This test is no t yet approved or cleared by the [place] FDA and  has been authorized for detection and/or diagnosis of SARS-CoV-2 by FDA under an Emergency Use Authorization (EUA). This EUA will remain  in effect (meaning this test can be used) for the duration of the COVID-19 declaration under Section 564(b)(1) of the Act, 21 U.S.C.section 360bbb-3(b)(1), unless the authorization is terminated  or revoked sooner.       Influenza A by PCR NEGATIVE NEGATIVE Final   Influenza B by PCR NEGATIVE NEGATIVE Final    Comment: (NOTE) The Xpert Xpress SARS-CoV-2/FLU/RSV plus assay is intended as an aid in the diagnosis of influenza from Nasopharyngeal swab specimens and should not be used as a sole basis for treatment. Nasal washings and aspirates are unacceptable for Xpert Xpress SARS-CoV-2/FLU/RSV testing.  Fact Sheet for Patients: BloggerCourse.com  Fact Sheet for Healthcare Providers: SeriousBroker.it  This test is not yet approved or cleared by the [place] FDA and has been authorized for detection and/or diagnosis of SARS-CoV-2 by FDA under an Emergency Use Authorization (EUA). This EUA will remain in effect (meaning this test can be used) for the duration of the COVID-19 declaration under Section 564(b)(1) of the Act, 21 U.S.C. section 360bbb-3(b)(1), unless the authorization is terminated or revoked.  Performed at [facility], [address] [place], [place] [address]          Radiology Studies: DG Chest 2 View  Result Date: 10/30/2020 CLINICAL DATA:  Chest pain and shortness of breath. EXAM: CHEST - 2 VIEW COMPARISON:  None. FINDINGS: The heart size and mediastinal contours are within normal limits. Both lungs are clear. Pulmonary hyperinflation is noted, suspicious for COPD. Thoracic spine degenerative changes are seen, as well as cervical spine fusion hardware. 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 No active cardiopulmonary disease. Electronically Signed   By: Danae Orleans M.D.   On: 10/30/2020 11:56   CT Head Wo Contrast  Result Date: 10/30/2020 CLINICAL DATA:  Numbness and tingling in left shoulder Shortness of breath Chest pain EXAM: CT HEAD WITHOUT CONTRAST TECHNIQUE: Contiguous axial images were obtained from the base of the skull through the vertex without intravenous contrast. COMPARISON:  None. FINDINGS: Brain: No evidence of acute infarction, hemorrhage, hydrocephalus, extra-axial  collection or mass lesion/mass effect. Vascular: No hyperdense vessel or unexpected calcification. Skull: Normal. Negative for fracture or focal lesion. Sinuses/Orbits: No acute finding. Other: None. IMPRESSION: No acute intracranial abnormality. Electronically Signed   By: Acquanetta BellingFarhaan  Mir M.D.   On: 10/30/2020 13:31        Scheduled Meds:  enoxaparin (LOVENOX) injection  40 mg Subcutaneous Q24H   Continuous Infusions:   LOS: 0 days    Time spent:40 min    Letasha Kershaw, Roselind MessierURTIS J, MD [facility]   If 7PM-7AM, please contact night-coverage 10/31/2020, 9:54 AM

## 2020-10-31 NOTE — Progress Notes (Addendum)
   Morene Crocker presented for a nuclear stress test today.  No immediate complications.  Stress imaging is pending at this time.  Preliminary EKG findings may be listed in the chart, but the stress test result will not be finalized until perfusion imaging is complete.  1 day study, [place] to read.  Prior to Bartlett administration, pt was having 4/10 chest pain.  W/ Lexiscan, pain increased to 6-7/10. L chest pain, a little worse w/ deep inspiration, no chest wall tenderness.   CP improved s/ SL NTG x 1, but did not resolve. SBP dropped to 105, so additional nitro not given. Morphine given w/ improvement. Pt given ginger ale to see if that would help pain, as he stated the pain felt like indigestion. Sx gradually improved.  Spoke w/ Dr Joseph Art, if images abnl, cards to see and cath in am.   Theodore Demark, PA-C 10/31/2020, 1:42 PM

## 2020-11-01 ENCOUNTER — Other Ambulatory Visit (HOSPITAL_COMMUNITY): Payer: Self-pay

## 2020-11-01 DIAGNOSIS — R7303 Prediabetes: Secondary | ICD-10-CM | POA: Diagnosis present

## 2020-11-01 DIAGNOSIS — R011 Cardiac murmur, unspecified: Secondary | ICD-10-CM

## 2020-11-01 DIAGNOSIS — E785 Hyperlipidemia, unspecified: Secondary | ICD-10-CM | POA: Diagnosis present

## 2020-11-01 DIAGNOSIS — D649 Anemia, unspecified: Secondary | ICD-10-CM

## 2020-11-01 DIAGNOSIS — R001 Bradycardia, unspecified: Secondary | ICD-10-CM

## 2020-11-01 LAB — CBC WITH DIFFERENTIAL/PLATELET
Abs Immature Granulocytes: 0.01 10*3/uL (ref 0.00–0.07)
Basophils Absolute: 0 10*3/uL (ref 0.0–0.1)
Basophils Relative: 1 %
Eosinophils Absolute: 0.3 10*3/uL (ref 0.0–0.5)
Eosinophils Relative: 7 %
HCT: 36.3 % — ABNORMAL LOW (ref 39.0–52.0)
Hemoglobin: 12 g/dL — ABNORMAL LOW (ref 13.0–17.0)
Immature Granulocytes: 0 %
Lymphocytes Relative: 51 %
Lymphs Abs: 1.9 10*3/uL (ref 0.7–4.0)
MCH: 29.9 pg (ref 26.0–34.0)
MCHC: 33.1 g/dL (ref 30.0–36.0)
MCV: 90.3 fL (ref 80.0–100.0)
Monocytes Absolute: 0.4 10*3/uL (ref 0.1–1.0)
Monocytes Relative: 9 %
Neutro Abs: 1.2 10*3/uL — ABNORMAL LOW (ref 1.7–7.7)
Neutrophils Relative %: 32 %
Platelets: 221 10*3/uL (ref 150–400)
RBC: 4.02 MIL/uL — ABNORMAL LOW (ref 4.22–5.81)
RDW: 12.7 % (ref 11.5–15.5)
WBC: 3.8 10*3/uL — ABNORMAL LOW (ref 4.0–10.5)
nRBC: 0 % (ref 0.0–0.2)

## 2020-11-01 LAB — COMPREHENSIVE METABOLIC PANEL
ALT: 13 U/L (ref 0–44)
AST: 21 U/L (ref 15–41)
Albumin: 3.5 g/dL (ref 3.5–5.0)
Alkaline Phosphatase: 41 U/L (ref 38–126)
Anion gap: 8 (ref 5–15)
BUN: 18 mg/dL (ref 8–23)
CO2: 25 mmol/L (ref 22–32)
Calcium: 8.8 mg/dL — ABNORMAL LOW (ref 8.9–10.3)
Chloride: 105 mmol/L (ref 98–111)
Creatinine, Ser: 1.04 mg/dL (ref 0.61–1.24)
GFR, Estimated: >60 mL/min (ref >60)
Glucose, Bld: 82 mg/dL (ref 70–99)
Potassium: 3.8 mmol/L (ref 3.5–5.1)
Sodium: 138 mmol/L (ref 135–145)
Total Bilirubin: 0.9 mg/dL (ref 0.3–1.2)
Total Protein: 6.1 g/dL — ABNORMAL LOW (ref 6.5–8.1)

## 2020-11-01 LAB — PHOSPHORUS: Phosphorus: 3.6 mg/dL (ref 2.5–4.6)

## 2020-11-01 LAB — MAGNESIUM: Magnesium: 2 mg/dL (ref 1.7–2.4)

## 2020-11-01 MED ORDER — METFORMIN HCL 500 MG PO TABS: 500.0000 mg | ORAL_TABLET | Freq: Every day | ORAL | Status: DC

## 2020-11-01 MED ORDER — ALUM & MAG HYDROXIDE-SIMETH 400-400-40 MG/5ML PO SUSP
15.0000 mL | Freq: Four times a day (QID) | ORAL | 0 refills | Status: AC | PRN
  Filled 2020-11-01: qty 355, 6d supply, fill #0

## 2020-11-01 MED ORDER — METFORMIN HCL 500 MG PO TABS
500.0000 mg | ORAL_TABLET | Freq: Every day | ORAL | 0 refills | Status: AC
  Filled 2020-11-01: qty 30, 30d supply, fill #0

## 2020-11-01 NOTE — Progress Notes (Signed)
Discharge packet given to pt. Medication and discharge education given with teach back. Pt is currently waiting for family member to pick him up.

## 2020-11-01 NOTE — Discharge Summary (Addendum)
Physician Discharge Summary  Juan Crosby WGN:[id number] DOB: 05-21-1940 DOA: 10/30/2020  PCP: [facility]  Admit date: 10/30/2020 Discharge date: 11/01/2020  Time spent: 35 minutes  Recommendations for Outpatient Follow-up:    Covid vaccination; vaccinated 4/4   Active Problems:   Chest pain   Chest pain - 7/6 paged by PA Theodore Demark, cardiology in the stress lab (stress test not started yet) actively having chest pain rated at 4/10.  My feeling is that patient should go for cardiac catheterization, and no stress test should be started on this patient who is now actively having chest pain again. - 7/6 PA Barrett will contact her cardiologist to determine if patient will go straight to cardiac catheterization. - Troponin x3 ordered stat.  EKG stat ordered. - NTG PRN, morphine PRN for pain not alleviated by NTG -7/6 echocardiogram nondiagnostic for chest pain see results below -7/6 NM stress test nondiagnostic for chest pain see results below. -7/6 no further cardiac work-up required.   Asymptomatic bradycardia -Patient continues to be asymptomatic. -Patient not orthostatic   HLD - 7/6 LDL 118 - 7/6 start Lipitor 20 mg daily.  Patient to have PCP titrate as tolerated, goal LDL<70  test.   Heart murmur -Check Echo.  Echocardiogram WNL see results below   Pre-diabetes -7/6 Hemoglobin A1c = 5.8, which would put him in the prediabetes group. - Continue low carb diet. - Start Metformin 500 mg daily, have PCP monitor patient's hemoglobin A1c   Iron deficiency anemia? -Continue iron supplement. [facility] - 7/7 Anemia panel was not obtained.  Patient should have PCP work-up.   Discharge Diagnoses:  Active Problems:   Chest pain   Bradycardia, unspecified   HLD (hyperlipidemia)   Heart murmur   Prediabetes   Anemia, unspecified   Discharge Condition: Stable  Diet recommendation: Carb modified  Filed Weights    10/30/20 1637 10/31/20 0509 11/01/20 0322  Weight: 78.4 kg 78.5 kg 77.9 kg    History of present illness:  80 y.o. BM PMHx Prediabetes, iron deficiency anemia, came with new onset of chest pain.   First episode started one week ago, patient was watching TV, suddenly started 7/10 pressure-like chest pain, retrosternal, nonradiating, no other associated symptoms, the whole episode lasted for few minutes and subsided by its own.  Then in the next few days, he has had several similar episode, or resolved by itself.  This morning, chest pain woke him up, 10/10, radiated to the left shoulder than upper arm, associated with severe sweating and shortness of breath. Called 911.   ED Course: EKG showed minimal ST elevation on lateral leads, physical exam found patient was bradycardia, blood pressure within normal limits no hypoxia.  Troponin negative x2.  Patient was given 1 dose of nitro and chest pain eased but later came back about 1 and half hour later, and had to take another nitro. No more chest pain since.  Hospital Course:  See above  Procedures: 7/6 Echocardiogram:Left Ventricle: LVEF  60 to 65%. No regional wall motion abnormalities. -Left ventricular diastolic parameters were normal.  7/6 nuclear medicine stress test;  No reversible ischemia or infarction. - Normal left ventricular wall motion. - Left ventricular ejection fraction 55% -Non invasive risk stratification*: Low   Consultations: Neurology  Cultures  7/5 SARS coronavirus negative 7/5 influenza A/B negative    Discharge Exam: Vitals:   11/01/20 0100 11/01/20 0200 11/01/20 0322 11/01/20 0414  BP:  132/72  Pulse:    (!) 48  Resp: 13 14  16   Temp:    97.8 F (36.6 C)  TempSrc:    Oral  SpO2:    96%  Weight:   77.9 kg   Height:        General:, A/O x4 No acute respiratory distress Eyes: negative scleral hemorrhage, negative anisocoria, negative icterus ENT: Negative Runny nose, negative gingival  bleeding, Neck:  Negative scars, masses, torticollis, lymphadenopathy, JVD Lungs: Clear to auscultation bilaterally without wheezes or crackles Cardiovascular: Regular rate and rhythm without murmur gallop or rub normal S1 and S2   Discharge Instructions   Allergies as of 11/01/2020       Reactions   Aspirin Other (See Comments)   Stomach acidity   Shellfish Allergy Swelling   Penicillins Rash        Medication List     TAKE these medications    alum & mag hydroxide-simeth 200-200-20 MG/5ML suspension Commonly known as: MAALOX/MYLANTA Take 15 mLs by mouth every 6 (six) hours as needed for indigestion or heartburn.   IRON PO Take 1 tablet by mouth daily.   metFORMIN 500 MG tablet Commonly known as: GLUCOPHAGE Take 1 tablet (500 mg total) by mouth daily with breakfast.   vitamin B-12 100 MCG tablet Commonly known as: CYANOCOBALAMIN Take 100 mcg by mouth daily.       Allergies  Allergen Reactions   Aspirin Other (See Comments)    Stomach acidity   Shellfish Allergy Swelling   Penicillins Rash      The results of significant diagnostics from this hospitalization (including imaging, microbiology, ancillary and laboratory) are listed below for reference.    Significant Diagnostic Studies: DG Chest 2 View  Result Date: 10/30/2020 CLINICAL DATA:  Chest pain and shortness of breath. EXAM: CHEST - 2 VIEW COMPARISON:  None. FINDINGS: The heart size and mediastinal contours are within normal limits. Both lungs are clear. Pulmonary hyperinflation is noted, suspicious for COPD. Thoracic spine degenerative changes are seen, as well as cervical spine fusion hardware. 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 No active cardiopulmonary disease. Electronically Signed   By: 12/31/2020 M.D.   On: 10/30/2020 11:56   CT Head Wo Contrast  Result Date: 10/30/2020 CLINICAL DATA:  Numbness and tingling in left shoulder Shortness of breath Chest pain EXAM: CT HEAD WITHOUT CONTRAST TECHNIQUE:  Contiguous axial images were obtained from the base of the skull through the vertex without intravenous contrast. COMPARISON:  None. FINDINGS: Brain: No evidence of acute infarction, hemorrhage, hydrocephalus, extra-axial collection or mass lesion/mass effect. Vascular: No hyperdense vessel or unexpected calcification. Skull: Normal. Negative for fracture or focal lesion. Sinuses/Orbits: No acute finding. Other: None. IMPRESSION: No acute intracranial abnormality. Electronically Signed   By: 12/31/2020 M.D.   On: 10/30/2020 13:31   NM Myocar Multi W/Spect 12/31/2020 Motion / EF  Result Date: 10/31/2020 CLINICAL DATA:  Chest pain/anginal equivalent. History of pre diabetes. EXAM: MYOCARDIAL IMAGING WITH SPECT (REST AND PHARMACOLOGIC-STRESS) GATED LEFT VENTRICULAR WALL MOTION STUDY LEFT VENTRICULAR EJECTION FRACTION TECHNIQUE: Standard myocardial SPECT imaging was performed after resting intravenous injection of 9.5 mCi Tc-54m tetrofosmin. Subsequently, intravenous infusion of Lexiscan was performed under the supervision of the Cardiology staff. At peak effect of the drug, 31.0 mCi Tc-7m tetrofosmin was injected intravenously and standard myocardial SPECT imaging was performed. Quantitative gated imaging was also performed to evaluate left ventricular wall motion, and estimate left ventricular ejection fraction. COMPARISON:  None. FINDINGS: Perfusion: No decreased activity in  the left ventricle on stress imaging to suggest reversible ischemia or infarction. Wall Motion: Normal left ventricular wall motion. No left ventricular dilation. Left Ventricular Ejection Fraction: 55 % End diastolic volume 97 ml End systolic volume 44 ml IMPRESSION: 1. No reversible ischemia or infarction. 2. Normal left ventricular wall motion. 3. Left ventricular ejection fraction 55% 4. Non invasive risk stratification*: Low *2012 Appropriate Use Criteria for Coronary Revascularization Focused Update: J Am Coll Cardiol. 2012;59(9):857-881.  http://content.dementiazones.comonlinejacc.org/article.aspx?articleid=1201161 Electronically Signed   By: Carey BullocksWilliam  Veazey M.D.   On: 10/31/2020 15:14   ECHOCARDIOGRAM COMPLETE  Result Date: 10/31/2020    ECHOCARDIOGRAM REPORT   Patient Name:   Juan Crosby Date of Exam: 10/31/2020 Medical Rec #:  [id number]        Height:       71.0 in Accession #:    [id number]       Weight:       173.1 lb Date of Birth:  11-12-1940        BSA:          1.983 m Patient Age:    79 years         BP:           125/67 mmHg Patient Gender: M                HR:           46 bpm. Exam Location:  Inpatient Procedure: 2D Echo, Cardiac Doppler and Color Doppler Indications:    Chest Pain R07.9  History:        Patient has no prior history of Echocardiogram examinations.  Sonographer:    Renella CunasJulia Swaim RDCS Referring Phys: [id number] Emeline GeneralPING T ZHANG IMPRESSIONS  1. Left ventricular ejection fraction, by estimation, is 60 to 65%. The left ventricle has normal function. The left ventricle has no regional wall motion abnormalities. Left ventricular diastolic parameters were normal.  2. Right ventricular systolic function is normal. The right ventricular size is normal.  3. The mitral valve is normal in structure. Trivial mitral valve regurgitation. No evidence of mitral stenosis.  4. The aortic valve is tricuspid. Aortic valve regurgitation is not visualized. Mild aortic valve sclerosis is present, with no evidence of aortic valve stenosis. Comparison(s): No prior Echocardiogram. Conclusion(s)/Recommendation(s): Normal biventricular function without evidence of hemodynamically significant valvular heart disease. FINDINGS  Left Ventricle: Left ventricular ejection fraction, by estimation, is 60 to 65%. The left ventricle has normal function. The left ventricle has no regional wall motion abnormalities. The left ventricular internal cavity size was normal in size. There is  no left ventricular hypertrophy. Left ventricular diastolic parameters were normal. Right  Ventricle: The right ventricular size is normal. No increase in right ventricular wall thickness. Right ventricular systolic function is normal. Left Atrium: Left atrial size was normal in size. Right Atrium: Right atrial size was normal in size. Pericardium: There is no evidence of pericardial effusion. Mitral Valve: The mitral valve is normal in structure. Trivial mitral valve regurgitation. No evidence of mitral valve stenosis. Tricuspid Valve: The tricuspid valve is normal in structure. Tricuspid valve regurgitation is trivial. No evidence of tricuspid stenosis. Aortic Valve: The aortic valve is tricuspid. Aortic valve regurgitation is not visualized. Mild aortic valve sclerosis is present, with no evidence of aortic valve stenosis. Pulmonic Valve: The pulmonic valve was normal in structure. Pulmonic valve regurgitation is trivial. No evidence of pulmonic stenosis. Aorta: The aortic root, ascending aorta and aortic arch are all structurally normal, with no  evidence of dilitation or obstruction. IAS/Shunts: The atrial septum is grossly normal.  LEFT VENTRICLE PLAX 2D LVIDd:         4.90 cm      Diastology LVIDs:         3.10 cm      LV e' medial:    3.83 cm/s LV PW:         0.90 cm      LV E/e' medial:  14.1 LV IVS:        0.90 cm      LV e' lateral:   6.08 cm/s LVOT diam:     2.30 cm      LV E/e' lateral: 8.9 LV SV:         92 LV SV Index:   46 LVOT Area:     4.15 cm  LV Volumes (MOD) LV vol d, MOD A2C: 109.0 ml LV vol d, MOD A4C: 100.0 ml LV vol s, MOD A2C: 40.5 ml LV vol s, MOD A4C: 49.4 ml LV SV MOD A2C:     68.5 ml LV SV MOD A4C:     100.0 ml LV SV MOD BP:      62.8 ml RIGHT VENTRICLE RV S prime:     12.60 cm/s TAPSE (M-mode): 2.6 cm LEFT ATRIUM             Index       RIGHT ATRIUM           Index LA diam:        3.10 cm 1.56 cm/m  RA Area:     16.50 cm LA Vol (A2C):   59.9 ml 30.21 ml/m RA Volume:   47.60 ml  24.00 ml/m LA Vol (A4C):   29.0 ml 14.62 ml/m LA Biplane Vol: 42.4 ml 21.38 ml/m  AORTIC  VALVE LVOT Vmax:   83.50 cm/s LVOT Vmean:  59.400 cm/s LVOT VTI:    0.221 m  AORTA Ao Root diam: 3.10 cm MITRAL VALVE MV Area (PHT): 3.27 cm    SHUNTS MV Decel Time: 232 msec    Systemic VTI:  0.22 m MV E velocity: 53.90 cm/s  Systemic Diam: 2.30 cm MV A velocity: 60.30 cm/s MV E/A ratio:  0.89 Jodelle Red MD Electronically signed by Jodelle Red MD Signature Date/Time: 10/31/2020/1:55:58 PM    Final     Microbiology: Recent Results (from the past 240 hour(s))  Resp Panel by RT-PCR (Flu A&B, Covid) Nasopharyngeal Swab     Status: None   Collection Time: 10/30/20  2:51 PM   Specimen: Nasopharyngeal Swab; Nasopharyngeal(NP) swabs in vial transport medium  Result Value Ref Range Status   SARS Coronavirus 2 by RT PCR NEGATIVE NEGATIVE Final    Comment: (NOTE) SARS-CoV-2 target nucleic acids are NOT DETECTED.  The SARS-CoV-2 RNA is generally detectable in upper respiratory specimens during the acute phase of infection. The lowest concentration of SARS-CoV-2 viral copies this assay can detect is 138 copies/mL. A negative result does not preclude SARS-Cov-2 infection and should not be used as the sole basis for treatment or other patient management decisions. A negative result may occur with  improper specimen collection/handling, submission of specimen other than nasopharyngeal swab, presence of viral mutation(s) within the areas targeted by this assay, and inadequate number of viral copies(<138 copies/mL). A negative result must be combined with clinical observations, patient history, and epidemiological information. The expected result is Negative.  Fact Sheet for Patients:  BloggerCourse.com  Fact Sheet for Healthcare  Providers:  SeriousBroker.it  This test is no t yet approved or cleared by the [place] and  has been authorized for detection and/or diagnosis of SARS-CoV-2 by FDA under an Emergency Use  Authorization (EUA). This EUA will remain  in effect (meaning this test can be used) for the duration of the COVID-19 declaration under Section 564(b)(1) of the Act, 21 U.S.C.section 360bbb-3(b)(1), unless the authorization is terminated  or revoked sooner.       Influenza A by PCR NEGATIVE NEGATIVE Final   Influenza B by PCR NEGATIVE NEGATIVE Final    Comment: (NOTE) The Xpert Xpress SARS-CoV-2/FLU/RSV plus assay is intended as an aid in the diagnosis of influenza from Nasopharyngeal swab specimens and should not be used as a sole basis for treatment. Nasal washings and aspirates are unacceptable for Xpert Xpress SARS-CoV-2/FLU/RSV testing.  Fact Sheet for Patients: BloggerCourse.com  Fact Sheet for Healthcare Providers: SeriousBroker.it  This test is not yet approved or cleared by the [place] FDA and has been authorized for detection and/or diagnosis of SARS-CoV-2 by FDA under an Emergency Use Authorization (EUA). This EUA will remain in effect (meaning this test can be used) for the duration of the COVID-19 declaration under Section 564(b)(1) of the Act, 21 U.S.C. section 360bbb-3(b)(1), unless the authorization is terminated or revoked.  Performed at [facility], [address] [place], [place] [address]      Labs: Basic Metabolic Panel: Recent Labs  Lab 10/30/20 1127 10/31/20 1021 11/01/20 0258  NA 138 138 138  K 3.5 4.0 3.8  CL 106 107 105  CO2 25 25 25   GLUCOSE 120* 85 82  BUN 19 15 18   CREATININE 1.25* 1.13 1.04  CALCIUM 9.4 9.2 8.8*  MG  --  2.0 2.0  PHOS  --  3.3 3.6   Liver Function Tests: Recent Labs  Lab 10/31/20 1021 11/01/20 0258  AST 22 21  ALT 14 13  ALKPHOS 43 41  BILITOT 1.0 0.9  PROT 6.6 6.1*  ALBUMIN 3.7 3.5   No results for input(s): LIPASE, AMYLASE in the last 168 hours. No results for input(s): AMMONIA in the last 168 hours. CBC: Recent Labs  Lab  10/30/20 1127 10/31/20 1021 11/01/20 0258  WBC 3.9* 3.3* 3.8*  NEUTROABS  --  1.1* 1.2*  HGB 12.5* 12.5* 12.0*  HCT 37.5* 38.9* 36.3*  MCV 90.8 91.5 90.3  PLT 235 229 221   Cardiac Enzymes: No results for input(s): CKTOTAL, CKMB, CKMBINDEX, TROPONINI in the last 168 hours. BNP: BNP (last 3 results) No results for input(s): BNP in the last 8760 hours.  ProBNP (last 3 results) No results for input(s): PROBNP in the last 8760 hours.  CBG: No results for input(s): GLUCAP in the last 168 hours.     Signed:  01/01/21, MD [facility]

## 2020-11-01 NOTE — Progress Notes (Signed)
   11/01/20 0934  Vitals  Temp 97.7 F (36.5 C)  Temp Source Oral  BP (!) 127/58  MAP (mmHg) 73  BP Location Right Arm  BP Method Automatic  Patient Position (if appropriate) Sitting  Pulse Rate 68  Pulse Rate Source Dinamap  Resp 18  MEWS COLOR  MEWS Score Color Green  Orthostatic Lying   BP- Lying 136/63  Pulse- Lying 60  Orthostatic Sitting  BP- Sitting 127/85  Pulse- Sitting 68  Orthostatic Standing at 0 minutes  BP- Standing at 0 minutes 124/67  Pulse- Standing at 0 minutes 72  Orthostatic Standing at 3 minutes  BP- Standing at 3 minutes 124/67  Pulse- Standing at 3 minutes 67  Oxygen Therapy  SpO2 97 %  MEWS Score  MEWS Temp 0  MEWS Systolic 0  MEWS Pulse 0  MEWS RR 0  MEWS LOC 0  MEWS Score 0

## 2020-11-01 NOTE — TOC Transition Note (Addendum)
Transition of Care [facility]) - CM/SW Discharge Note   Patient Details  Name: Arch Methot MRN: [id number] Date of Birth: 01-01-1941  [facility]) CM/SW Contact:  Leone Haven, RN Phone Number: 11/01/2020, 9:45 AM   Clinical Narrative:    Patient is for dc today, he states he goes to [place].  His PCP is Dr. Dolan Amen, fax [phone].  CSW is [employer] ext [phone].  NCM is contacting [facility] notification [facility] also. Referral number is [id number].  Will fax dc summary to PCP.   Final next level of care: Home/Self Care Barriers to Discharge: No Barriers Identified   Patient Goals and CMS Choice Patient states their goals for this hospitalization and ongoing recovery are:: return home   Choice offered to / list presented to : NA  Discharge Placement                       Discharge Plan and Services                  DME Agency: NA       HH Arranged: NA          Social Determinants of Health (SDOH) Interventions     Readmission Risk Interventions No flowsheet data found.

## 2023-03-02 ENCOUNTER — Other Ambulatory Visit: Payer: Self-pay

## 2023-03-02 ENCOUNTER — Encounter (HOSPITAL_BASED_OUTPATIENT_CLINIC_OR_DEPARTMENT_OTHER): Payer: Self-pay | Admitting: Pediatrics

## 2023-03-02 ENCOUNTER — Emergency Department (HOSPITAL_BASED_OUTPATIENT_CLINIC_OR_DEPARTMENT_OTHER)
Admission: EM | Admit: 2023-03-02 | Discharge: 2023-03-02 | Disposition: A | Discharge status: Home / Self Care | Payer: No Typology Code available for payment source | Attending: Emergency Medicine | Admitting: Emergency Medicine

## 2023-03-02 DIAGNOSIS — S61213A Laceration without foreign body of left middle finger without damage to nail, initial encounter: Secondary | ICD-10-CM | POA: Diagnosis not present

## 2023-03-02 DIAGNOSIS — S6992XA Unspecified injury of left wrist, hand and finger(s), initial encounter: Secondary | ICD-10-CM | POA: Diagnosis present

## 2023-03-02 DIAGNOSIS — W260XXA Contact with knife, initial encounter: Secondary | ICD-10-CM | POA: Diagnosis not present

## 2023-03-02 DIAGNOSIS — Z23 Encounter for immunization: Secondary | ICD-10-CM | POA: Diagnosis not present

## 2023-03-02 MED ORDER — ACETAMINOPHEN 500 MG PO TABS
1000.0000 mg | ORAL_TABLET | Freq: Once | ORAL | Status: AC
  Administered 2023-03-02: 1000 mg via ORAL
  Filled 2023-03-02: qty 2

## 2023-03-02 MED ORDER — LIDOCAINE HCL (PF) 1 % IJ SOLN
10.0000 mL | Freq: Once | INTRAMUSCULAR | Status: AC
  Administered 2023-03-02: 10 mL
  Filled 2023-03-02: qty 10

## 2023-03-02 MED ORDER — TETANUS-DIPHTH-ACELL PERTUSSIS 5-2.5-18.5 LF-MCG/0.5 IM SUSY
0.5000 mL | PREFILLED_SYRINGE | Freq: Once | INTRAMUSCULAR | Status: AC
  Administered 2023-03-02: 0.5 mL via INTRAMUSCULAR
  Filled 2023-03-02: qty 0.5

## 2023-03-02 NOTE — Discharge Instructions (Addendum)
You must get your sutures removed in 7-10 days. We recommend visiting your PCP or an urgent care for suture removal. However, you may also return back to the ER if you are unable to be seen by your PCP or at urgent care.   You may gently clean the area around your laceration as needed with soap and water. Place antibiotic ointment such as bacitracin or neosporin over your laceration after cleaning the area.  Keep the laceration covered with sterile gauze as shown here if you are doing an activity in which it may get dirty. You may pick these supplies up at any drugstore.  Do not submerge your laceration in water (no baths, swimming) until it is fully healed. You may shower.   Your tetanus was updated here today. You are covered for the next 10 years.  You may take up to 1000mg  of tylenol every 6 hours as needed for pain.  Do not take more then 4g per day.  You were given your first dose here today.  Your next dose can be no sooner than 7 PM tonight  Return to the ER should you develop fever, chills, pus drainage from your wound, redness around your wound.

## 2023-03-02 NOTE — ED Provider Notes (Signed)
[facility] Provider Note   CSN: [id number] Arrival date & time: 03/02/23  1141     History  Chief Complaint  Patient presents with   Laceration    Juan Crosby is a 82 y.o. male who presents with concern for cutting his left middle finger on a machete earlier today.  He denies any numbness or tingling of the finger.  Bleeding overall well-controlled upon arrival.  He is not on any blood thinners.  He does not know when his last tetanus was.   Laceration      Home Medications Prior to Admission medications   Medication Sig Start Date End Date Taking? Authorizing Provider  alum & mag hydroxide-simeth (MAALOX PLUS) 400-400-40 MG/5ML suspension Take 15 mLs by mouth every 6 (six) hours as needed for indigestion. 11/01/20   Drema Dallas, MD  Ferrous Sulfate (IRON PO) Take 1 tablet by mouth daily.    [provider]  metFORMIN (GLUCOPHAGE) 500 MG tablet Take 1 tablet (500 mg total) by mouth daily with breakfast. 11/01/20   Drema Dallas, MD  vitamin B-12 (CYANOCOBALAMIN) 100 MCG tablet Take 100 mcg by mouth daily.    [provider]      Allergies    Aspirin, Shellfish allergy, and Penicillins    Review of Systems   Review of Systems  Skin:  Positive for wound.    Physical Exam Updated Vital Signs BP (!) 151/75 (BP Location: Right Arm)   Pulse 65   Temp 98.1 F (36.7 C) (Oral)   Resp 16   Ht 5\' 10"  (1.778 m)   Wt 82.6 kg   SpO2 97%   BMI 26.11 kg/m  Physical Exam Vitals and nursing note reviewed.  Constitutional:      Appearance: Normal appearance.  HENT:     Head: Atraumatic.  Pulmonary:     Effort: Pulmonary effort is normal.  Skin:    Comments: Approximately 2 cm laceration over the lateral aspect of the left third digit middle phalanx.  No foreign debris noted.  Able to fully flex and extend at the left third digit MCP, PIP, DIP.  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  Intact sensation of the left 1st through 5th digits and cap refill of the left third digit under 2 seconds.   Re-evaluation after suture placement: Able to fully flex and extend at the left third digit MCP, PIP, DIP.  Intact sensation of the left third digit.  Left third digit cap refill under 2 seconds.  Neurological:     General: No focal deficit present.     Mental Status: He is alert.  Psychiatric:        Mood and Affect: Mood normal.        Behavior: Behavior normal.     ED Results / Procedures / Treatments   Labs (all labs ordered are listed, but only abnormal results are displayed) Labs Reviewed - No data to display  EKG None  Radiology No results found.  Procedures .Marland KitchenLaceration Repair  Date/Time: 03/02/2023 1:23 PM  Performed by: Arabella Merles, PA-C Authorized by: Arabella Merles, PA-C   Consent:    Consent obtained:  Verbal   Consent given by:  Patient   Risks, benefits, and alternatives were discussed: yes     Risks discussed:  Infection, pain, need for additional repair, poor cosmetic result, nerve damage and tendon damage   Alternatives discussed:  No  treatment Universal protocol:    Patient identity confirmed:  Verbally with patient Anesthesia:    Anesthesia method:  Nerve block   Block location:  Left 3rd digit   Block anesthetic:  Lidocaine 1% w/o epi (7ml)   Block injection procedure:  Anatomic landmarks identified, introduced needle, negative aspiration for blood and anatomic landmarks palpated   Block outcome:  Anesthesia achieved Laceration details:    Location:  Finger   Finger location:  L long finger   Length (cm):  2 Exploration:    Contaminated: no   Treatment:    Area cleansed with:  Chlorhexidine   Amount of cleaning:  Standard   Irrigation solution:  Sterile saline   Irrigation volume:  1L   Irrigation method:  Syringe   Foreign body removal: No foreign debris.     Debridement:  None Skin repair:    Repair  method:  Sutures   Suture size:  5-0   Suture material:  Prolene   Suture technique:  Simple interrupted   Number of sutures:  5 Repair type:    Repair type:  Simple Post-procedure details:    Dressing:  Antibiotic ointment and sterile dressing   Procedure completion:  Tolerated well, no immediate complications     Medications Ordered in ED Medications  lidocaine (PF) (XYLOCAINE) 1 % injection 10 mL (10 mLs Infiltration Given 03/02/23 1243)  Tdap (BOOSTRIX) injection 0.5 mL (0.5 mLs Intramuscular Given 03/02/23 1244)  acetaminophen (TYLENOL) tablet 1,000 mg (1,000 mg Oral Given 03/02/23 1243)    ED Course/ Medical Decision Making/ A&P                                 Medical Decision Making Risk OTC drugs. Prescription drug management.     Differential diagnosis includes but is not limited to laceration, tendon injury, nerve injury  ED Course:  Patient with 2 cm laceration to the left third digit over the lateral aspect of the middle phalanx.  Neurovascularly intact of the left third digit.  Able to fully flex and extend at the left third MCP, PIP, DIP.  Laceration was repaired with 5 sutures.  Patient tolerated this well.  Neurovascularly intact after suture placement. Patient given Tylenol for pain and nerve block performed. Patient's tetanus was updated   Impression: Left third digit laceration  Disposition:  The patient was discharged home with instructions to get sutures removed in 7 to 10 days.  Keep the area clean with soap and water.  May apply antibiotic ointment over the area. Return precautions given.             Final Clinical Impression(s) / ED Diagnoses Final diagnoses:  Laceration of left middle finger without foreign body without damage to nail, initial encounter    Rx / DC Orders ED Discharge Orders     None         Arabella Merles, PA-C 03/02/23 1328    Laurence Spates, MD 03/02/23 1958

## 2023-03-02 NOTE — ED Triage Notes (Signed)
Laceration on left middle finger from a machete while cutting tree branches,
